# Patient Record
Sex: Female | Born: 1965 | Race: Black or African American | Hispanic: No | Marital: Single | Smoking: Never smoker
Health system: Southern US, Community
[De-identification: ages and names within clinical notes are randomized; demographics above are authoritative.]

## PROBLEM LIST (undated history)

## (undated) DIAGNOSIS — I10 Essential (primary) hypertension: Secondary | ICD-10-CM

## (undated) HISTORY — PX: WISDOM TOOTH EXTRACTION: SHX21

---

## 2016-09-14 ENCOUNTER — Encounter (HOSPITAL_BASED_OUTPATIENT_CLINIC_OR_DEPARTMENT_OTHER): Payer: Self-pay | Admitting: *Deleted

## 2016-09-14 ENCOUNTER — Emergency Department (HOSPITAL_BASED_OUTPATIENT_CLINIC_OR_DEPARTMENT_OTHER)
Admission: EM | Admit: 2016-09-14 | Discharge: 2016-09-14 | Disposition: A | Discharge status: Home / Self Care | Payer: Federal, State, Local not specified - PPO | Attending: Emergency Medicine | Admitting: Emergency Medicine

## 2016-09-14 ENCOUNTER — Emergency Department (HOSPITAL_BASED_OUTPATIENT_CLINIC_OR_DEPARTMENT_OTHER): Payer: Federal, State, Local not specified - PPO

## 2016-09-14 DIAGNOSIS — Z91040 Latex allergy status: Secondary | ICD-10-CM | POA: Diagnosis not present

## 2016-09-14 DIAGNOSIS — G44229 Chronic tension-type headache, not intractable: Secondary | ICD-10-CM | POA: Diagnosis not present

## 2016-09-14 DIAGNOSIS — I10 Essential (primary) hypertension: Secondary | ICD-10-CM | POA: Insufficient documentation

## 2016-09-14 DIAGNOSIS — R51 Headache: Secondary | ICD-10-CM | POA: Diagnosis present

## 2016-09-14 HISTORY — DX: Essential (primary) hypertension: I10

## 2016-09-14 LAB — BASIC METABOLIC PANEL
ANION GAP: 10 (ref 5–15)
BUN: 15 mg/dL (ref 6–20)
CALCIUM: 9.5 mg/dL (ref 8.9–10.3)
CO2: 26 mmol/L (ref 22–32)
Chloride: 104 mmol/L (ref 101–111)
Creatinine, Ser: 0.73 mg/dL (ref 0.44–1.00)
Glucose, Bld: 91 mg/dL (ref 65–99)
POTASSIUM: 3.7 mmol/L (ref 3.5–5.1)
Sodium: 140 mmol/L (ref 135–145)

## 2016-09-14 LAB — CBC WITH DIFFERENTIAL/PLATELET
BASOS ABS: 0 10*3/uL (ref 0.0–0.1)
BASOS PCT: 0 %
Eosinophils Absolute: 0.2 10*3/uL (ref 0.0–0.7)
Eosinophils Relative: 4 %
HEMATOCRIT: 40.4 % (ref 36.0–46.0)
HEMOGLOBIN: 13.8 g/dL (ref 12.0–15.0)
Lymphocytes Relative: 36 %
Lymphs Abs: 1.7 10*3/uL (ref 0.7–4.0)
MCH: 32.3 pg (ref 26.0–34.0)
MCHC: 34.2 g/dL (ref 30.0–36.0)
MCV: 94.6 fL (ref 78.0–100.0)
Monocytes Absolute: 0.4 10*3/uL (ref 0.1–1.0)
Monocytes Relative: 8 %
NEUTROS ABS: 2.4 10*3/uL (ref 1.7–7.7)
Neutrophils Relative %: 52 %
Platelets: 230 10*3/uL (ref 150–400)
RBC: 4.27 MIL/uL (ref 3.87–5.11)
RDW: 12.2 % (ref 11.5–15.5)
WBC: 4.7 10*3/uL (ref 4.0–10.5)

## 2016-09-14 MED ORDER — PROCHLORPERAZINE EDISYLATE 5 MG/ML IJ SOLN: 10.0000 mg | Freq: Once | INTRAMUSCULAR | Status: DC

## 2016-09-14 MED ORDER — DIPHENHYDRAMINE HCL 50 MG/ML IJ SOLN: 12.5000 mg | Freq: Once | INTRAMUSCULAR | Status: DC

## 2016-09-14 MED ORDER — AMLODIPINE BESYLATE 10 MG PO TABS: 10.0000 mg | ORAL_TABLET | Freq: Every day | ORAL | 0 refills | Status: DC

## 2016-09-14 MED ORDER — SODIUM CHLORIDE 0.9 % IV BOLUS (SEPSIS)
1000.0000 mL | Freq: Once | INTRAVENOUS | Status: AC
  Administered 2016-09-14: 1000 mL via INTRAVENOUS

## 2016-09-14 MED ORDER — KETOROLAC TROMETHAMINE 30 MG/ML IJ SOLN
30.0000 mg | Freq: Once | INTRAMUSCULAR | Status: AC
  Administered 2016-09-14: 30 mg via INTRAVENOUS
  Filled 2016-09-14: qty 1

## 2016-09-14 MED ORDER — AMLODIPINE BESYLATE 5 MG PO TABS
5.0000 mg | ORAL_TABLET | Freq: Once | ORAL | Status: AC
  Administered 2016-09-14: 5 mg via ORAL
  Filled 2016-09-14: qty 1

## 2016-09-14 MED ORDER — ACETAMINOPHEN 325 MG PO TABS
650.0000 mg | ORAL_TABLET | Freq: Once | ORAL | Status: AC
  Administered 2016-09-14: 650 mg via ORAL
  Filled 2016-09-14: qty 2

## 2016-09-14 NOTE — ED Notes (Signed)
Crushed amlodipine and put in applesauce for patient

## 2016-09-14 NOTE — ED Notes (Signed)
Pt verbalizes understanding of d/c instructions and denies any further needs at this time. 

## 2016-09-14 NOTE — Discharge Instructions (Signed)
Please read attached information regarding your condition. Take amlodipine once daily. Take Tylenol or ibuprofen as needed for headache. [facility] at [facility] for further evaluation. Return to ED for worsening headache, head injury, vision changes, chest pain, trouble breathing, leg swelling, numbness, weakness.

## 2016-09-14 NOTE — ED Triage Notes (Addendum)
Headache x 3 days. Pain in her right jaw. States she was given BP medication in December but she did not start taking it.

## 2016-09-14 NOTE — ED Provider Notes (Signed)
[facility]   CSN: [id number] Arrival date & time: 09/14/16  1637     History   Chief Complaint Chief Complaint  Patient presents with  . Headache    HPI Jennifer Griffin is a 50 y.o. female.  HPI Patient, presents to ED for evaluation of headache. States that the headache has been chronic for the past few months. She also reports uncontrolled blood pressure. States that she was started on an unknown blood pressure medication 9 months ago at an urgent care. She took a few doses but stated that "I vomited out the pills whole every time." She has not taken any medication since then. She does have a history of migraines related to menstrual cycle however she states that this headache for the past few weeks has felt more like "a tight band around my head." She has no relief in symptoms with Motrin. However she states that she is taking children's Motrin because she does not like swallowing pills. She was recently evaluated by another provider 2 days ago and was told that she had a normal neurological exam, normal visual exam. However they did recommend a CT of the head. She denies any nausea, vomiting, numbness, chest pain, trouble breathing, lightheadedness, dizziness, headache injury or fall.  Past Medical History:  Diagnosis Date  . Hypertension     There are no active problems to display for this patient.   History reviewed. No pertinent surgical history.  OB History    No data available       Home Medications    Prior to Admission medications   Medication Sig Start Date End Date Taking? Authorizing Provider  amLODipine (NORVASC) 10 MG tablet Take 1 tablet (10 mg total) by mouth daily. 09/14/16   Dietrich Pates, PA-C    Family History No family history on file.  Social History Social History  Substance Use Topics  . Smoking status: Never Smoker  . Smokeless tobacco: Never Used  . Alcohol use No     Allergies   Latex   Review of  Systems Review of Systems  Constitutional: Negative for appetite change, chills and fever.  HENT: Negative for ear pain, rhinorrhea, sneezing and sore throat.   Eyes: Negative for photophobia and visual disturbance.  Respiratory: Negative for cough, chest tightness, shortness of breath and wheezing.   Cardiovascular: Negative for chest pain and palpitations.  Gastrointestinal: Negative for abdominal pain, blood in stool, constipation, diarrhea, nausea and vomiting.  Genitourinary: Negative for dysuria, hematuria and urgency.  Musculoskeletal: Negative for myalgias.  Skin: Negative for rash.  Neurological: Positive for headaches. Negative for dizziness, weakness and light-headedness.     Physical Exam Updated Vital Signs BP (!) 166/113   Pulse 77   Temp 98.1 F (36.7 C) (Oral)   Resp 14   Ht  (1.575 m)   Wt 68 kg (150 lb)   LMP 06/15/2015 (Approximate)   SpO2 99%   BMI 27.44 kg/m   Physical Exam  Constitutional: She is oriented to person, place, and time. She appears well-developed and well-nourished. No distress.  HENT:  Head: Normocephalic and atraumatic.  Nose: Nose normal.  Eyes: Conjunctivae and EOM are normal. Left eye exhibits no discharge. No scleral icterus.  Neck: Normal range of motion. Neck supple.  Cardiovascular: Normal rate, regular rhythm, normal heart sounds and intact distal pulses.  Exam reveals no gallop and no friction rub.   No murmur heard. Pulmonary/Chest: Effort normal and breath sounds normal. No respiratory  distress.  Abdominal: Soft. Bowel sounds are normal. She exhibits no distension. There is no tenderness. There is no guarding.  Musculoskeletal: Normal range of motion. She exhibits no edema.  Neurological: She is alert and oriented to person, place, and time. No cranial nerve deficit or sensory deficit. She exhibits normal muscle tone. Coordination normal.  Pupils reactive. No facial asymmetry noted. Cranial nerves appear grossly intact.  Sensation intact to light touch on face, BUE and BLE. Strength 5/5 in BUE and BLE. Normal patellar reflexes bilaterally.  Skin: Skin is warm and dry. No rash noted.  Psychiatric: She has a normal mood and affect.  Nursing note and vitals reviewed.    ED Treatments / Results  Labs (all labs ordered are listed, but only abnormal results are displayed) Labs Reviewed  BASIC METABOLIC PANEL  CBC WITH DIFFERENTIAL/PLATELET    EKG  EKG Interpretation  Date/Time:  Thursday September 14 2016 17:02:41 EDT Ventricular Rate:  64 PR Interval:  178 QRS Duration: 76 QT Interval:  378 QTC Calculation: 389 R Axis:   54 Text Interpretation:  Normal sinus rhythm Cannot rule out Anterior infarct , age undetermined Abnormal ECG normal. no old comparison Confirmed by Arby Barrette [phone]) on 09/14/2016 6:56:30 PM       Radiology Ct Head Wo Contrast  Result Date: 09/14/2016 CLINICAL DATA:  Chronic headaches for many years with increasing headaches for the last 3 days. EXAM: CT HEAD WITHOUT CONTRAST TECHNIQUE: Contiguous axial images were obtained from the base of the skull through the vertex without intravenous contrast. COMPARISON:  None. FINDINGS: Brain: No evidence of acute infarction, hemorrhage, hydrocephalus, extra-axial collection or mass lesion/mass effect. Vascular: No hyperdense vessel or unexpected calcification. Skull: Normal. Negative for fracture or focal lesion. Sinuses/Orbits: No acute finding. Other: None. IMPRESSION: No focal acute intracranial abnormality identified. Electronically Signed   By: Sherian Rein M.D.   On: 09/14/2016 18:17    Procedures Procedures (including critical care time)  Medications Ordered in ED Medications  sodium chloride 0.9 % bolus 1,000 mL (0 mLs Intravenous Stopped 09/14/16 1903)  acetaminophen (TYLENOL) tablet 650 mg (650 mg Oral Given 09/14/16 1904)  amLODipine (NORVASC) tablet 5 mg (5 mg Oral Given 09/14/16 2008)  ketorolac (TORADOL) 30 MG/ML injection  30 mg (30 mg Intravenous Given 09/14/16 2020)     Initial Impression / Assessment and Plan / ED Course  I have reviewed the triage vital signs and the nursing notes.  Pertinent labs & imaging results that were available during my care of the patient were reviewed by me and considered in my medical decision making (see chart for details).     Patient presents to ED for evaluation of headache that has been chronic for the past few months. She also has a history of hypertension which she states she is on any medications for. She states she is "not good with swallowing pills because I just vomited them up all." For headache she has tried Children's Motrin with no improvement in symptoms. She denies any vision changes, numbness, weakness, chest pain or shortness of breath. There are no focal deficits on neurological exam. She is otherwise nontoxic appearing and in no acute distress. Blood pressure is elevated here at 170s/100s. She is unsure of which blood pressure medication she was on December. CT of the head was unremarkable. BMP and CBC are unremarkable. Her vital signs are otherwise normal. Patient given fluids, Tylenol with no improvement in headache. She does not have a ride home so unable to  get migraine cocktail. We'll give Toradol and reassess.  Patient reports some improvement with Toradol. However blood pressure still continues to be elevated, although has improved since initial presentation. Patient given amlodipine here in the ED and will discharge with amlodipine and advised to [facility] at [facility] for further evaluation. Patient appears stable for discharge at this time. Strict return precautions given.  Patient discussed with Dr. Donnald GarrePfeiffer.  Final Clinical Impressions(s) / ED Diagnoses   Final diagnoses:  Chronic tension-type headache, not intractable  Hypertension, unspecified type    New Prescriptions New Prescriptions   AMLODIPINE (NORVASC) 10 MG TABLET    Take  1 tablet (10 mg total) by mouth daily.     Dietrich PatesKhatri, Shanette Tamargo, PA-C 09/15/16 0018    Arby BarrettePfeiffer, Marcy, MD 09/21/16 [phone]

## 2018-03-04 IMAGING — CT CT HEAD W/O CM
3 series · 15 of 46 positions shown, 18 images · non-contrast
Comparison: None.

CLINICAL DATA: Chronic headaches for many years with increasing
headaches for the last 3 days.

EXAM:
CT HEAD WITHOUT CONTRAST
TECHNIQUE: Contiguous axial images were obtained from the base of the skull
through the vertex without intravenous contrast.

[Series 2: head wo · axial · 0.39mm/px · z∈[-98,+22]mm · 9 of 29 slices shown, 12 images]
[im 3/29  brain]
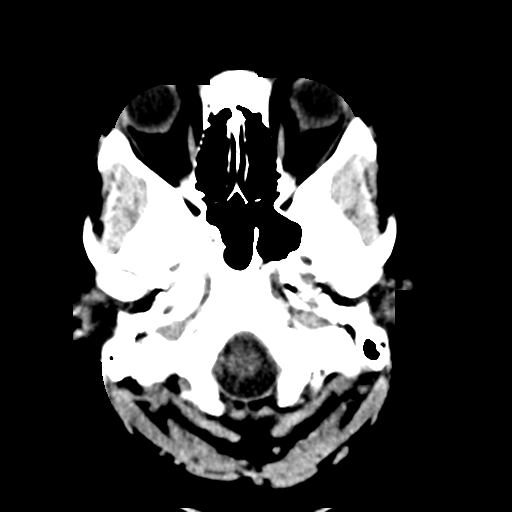
[im 3/29  bone]
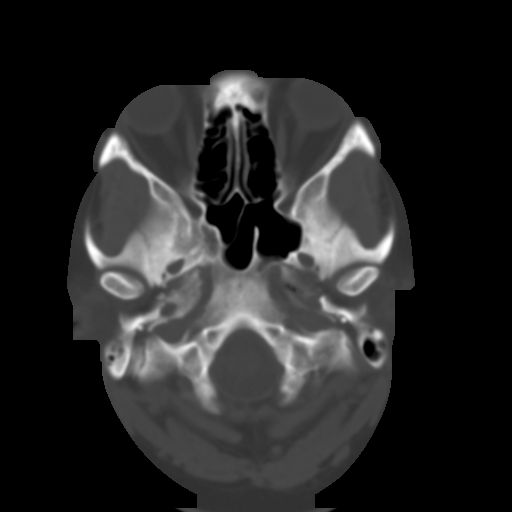
[im 6/29  brain]
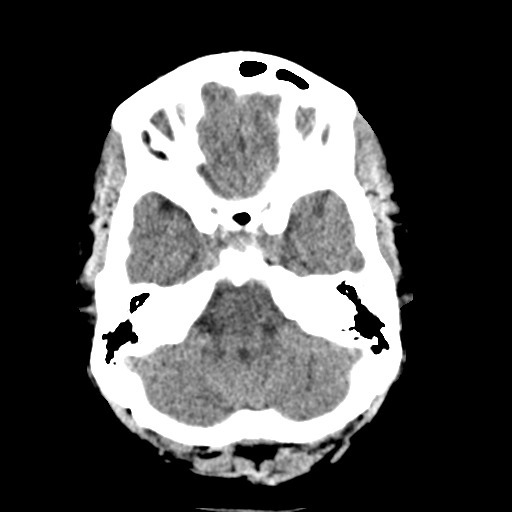
[im 9/29  brain]
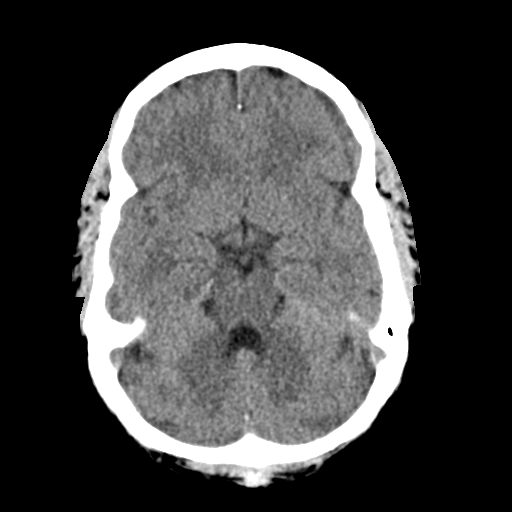
[im 12/29  brain]
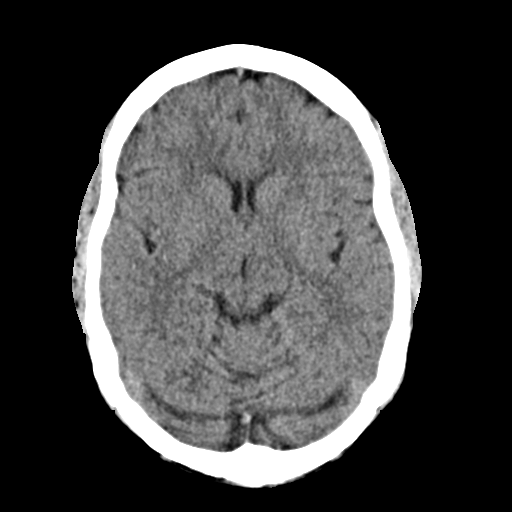
[im 15/29  brain]
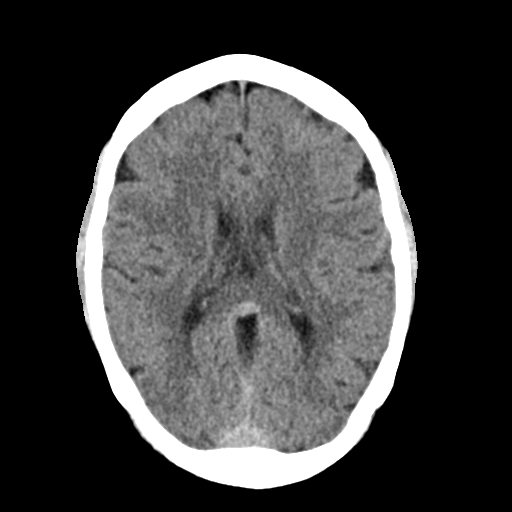
[im 15/29  bone]
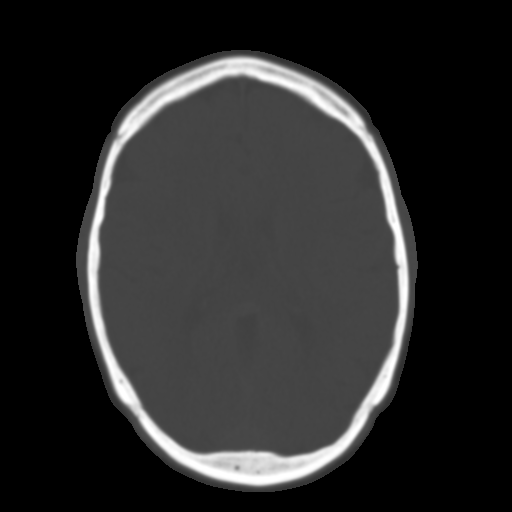
[im 18/29  brain]
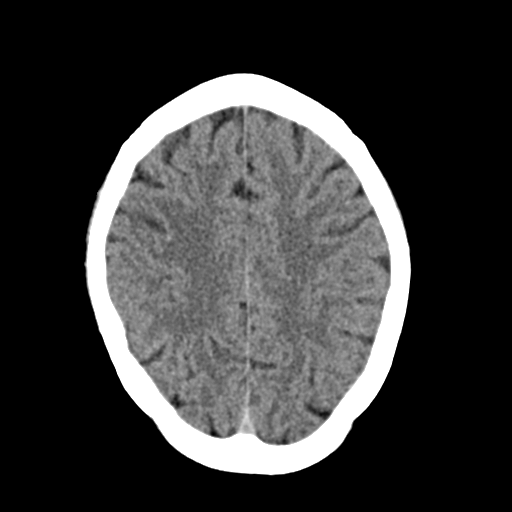
[im 21/29  brain]
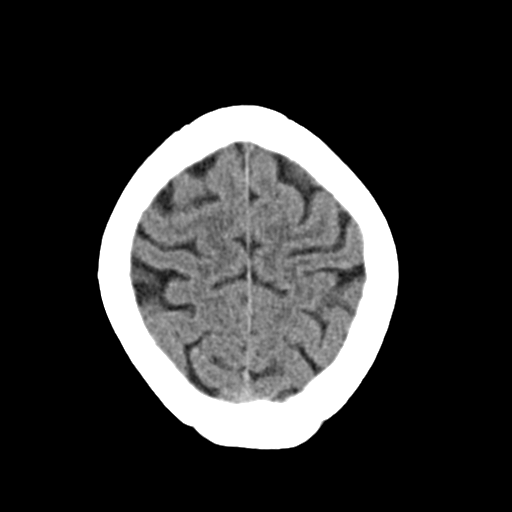
[im 24/29  brain]
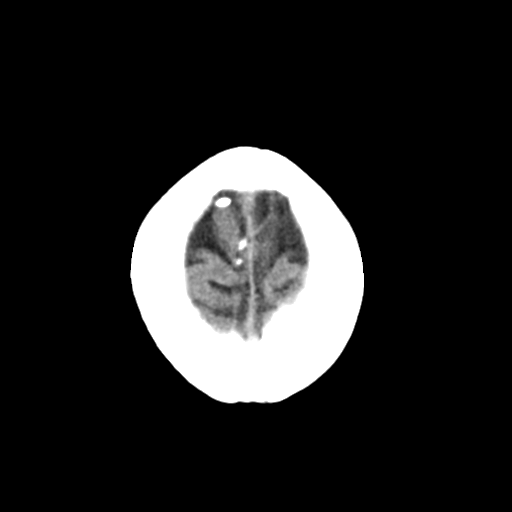
[im 27/29  brain]
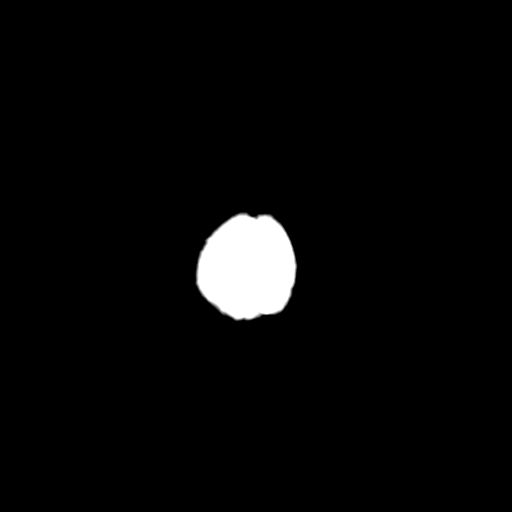
[im 27/29  bone]
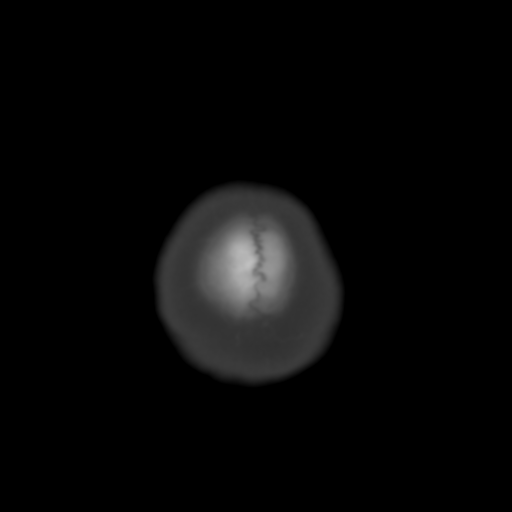

[Series 4: coronal soft · coronal · 0.28mm/px · 3 of 67 slices shown]
[im 23/67  brain]
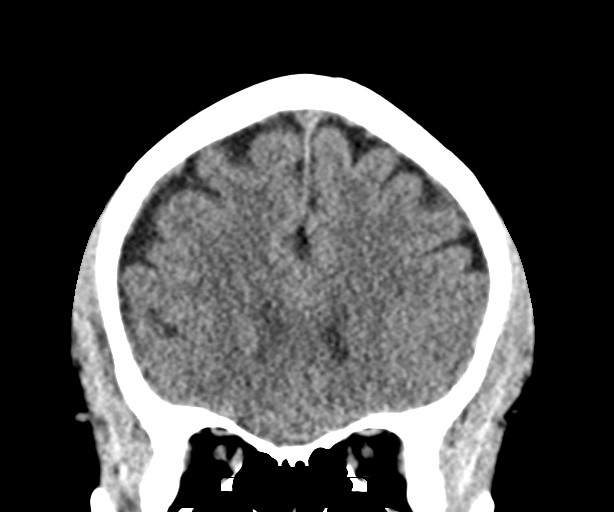
[im 30/67  brain]
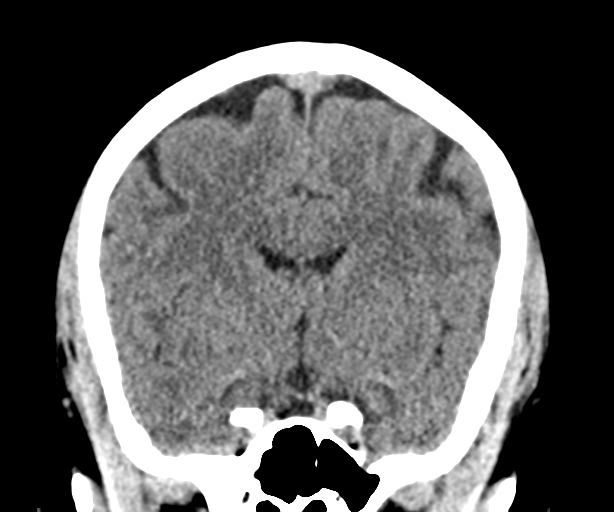
[im 37/67  brain]
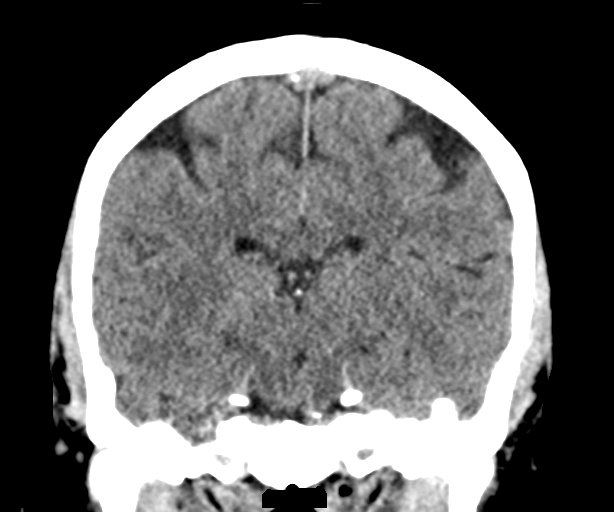

[Series 5: sag soft · sagittal · 0.28mm/px · 3 of 57 slices shown]
[im 19/57  brain]
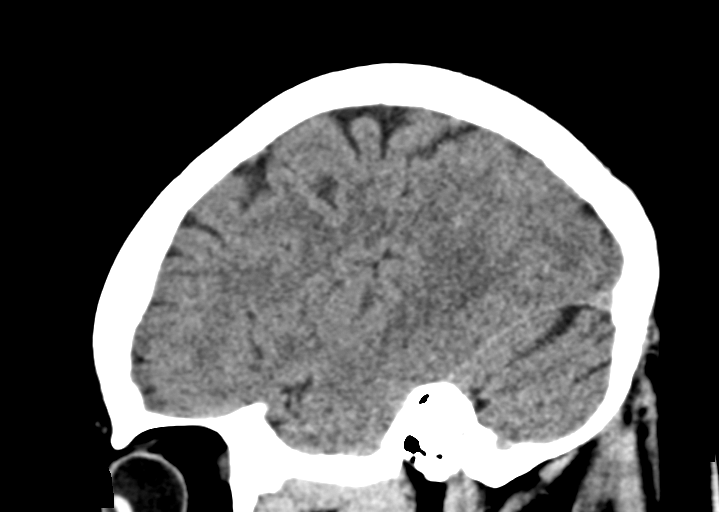
[im 29/57  brain]
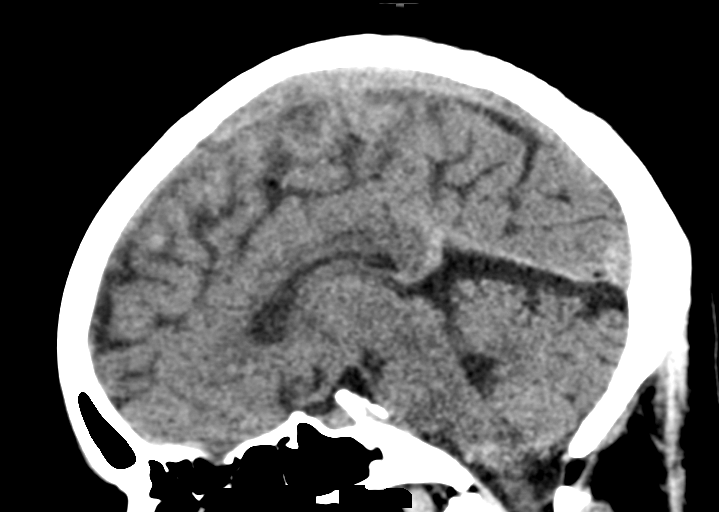
[im 38/57  brain]
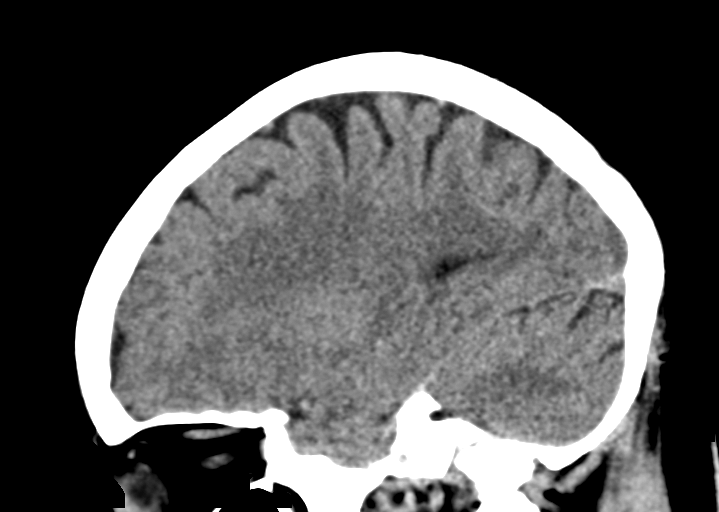

[15 of 46 positions shown; findings below may reference images not displayed]

FINDINGS: Brain: No evidence of acute infarction, hemorrhage, hydrocephalus,
extra-axial collection or mass lesion/mass effect.

Vascular: No hyperdense vessel or unexpected calcification.

Skull: Normal. Negative for fracture or focal lesion.

Sinuses/Orbits: No acute finding.

Other: None.
IMPRESSION: No focal acute intracranial abnormality identified.

## 2020-11-13 ENCOUNTER — Other Ambulatory Visit: Payer: Self-pay

## 2020-11-13 ENCOUNTER — Encounter (HOSPITAL_BASED_OUTPATIENT_CLINIC_OR_DEPARTMENT_OTHER): Payer: Self-pay | Admitting: Emergency Medicine

## 2020-11-13 ENCOUNTER — Emergency Department (HOSPITAL_BASED_OUTPATIENT_CLINIC_OR_DEPARTMENT_OTHER)
Admission: EM | Admit: 2020-11-13 | Discharge: 2020-11-13 | Disposition: A | Discharge status: Home / Self Care | Payer: Federal, State, Local not specified - PPO | Attending: Emergency Medicine | Admitting: Emergency Medicine

## 2020-11-13 DIAGNOSIS — Z91040 Latex allergy status: Secondary | ICD-10-CM | POA: Diagnosis not present

## 2020-11-13 DIAGNOSIS — I10 Essential (primary) hypertension: Secondary | ICD-10-CM | POA: Insufficient documentation

## 2020-11-13 DIAGNOSIS — R197 Diarrhea, unspecified: Secondary | ICD-10-CM

## 2020-11-13 DIAGNOSIS — Z79899 Other long term (current) drug therapy: Secondary | ICD-10-CM | POA: Insufficient documentation

## 2020-11-13 DIAGNOSIS — Z20822 Contact with and (suspected) exposure to covid-19: Secondary | ICD-10-CM | POA: Insufficient documentation

## 2020-11-13 DIAGNOSIS — N3001 Acute cystitis with hematuria: Secondary | ICD-10-CM

## 2020-11-13 LAB — CBC
HCT: 45.7 % (ref 36.0–46.0)
Hemoglobin: 15.7 g/dL — ABNORMAL HIGH (ref 12.0–15.0)
MCH: 32.4 pg (ref 26.0–34.0)
MCHC: 34.4 g/dL (ref 30.0–36.0)
MCV: 94.2 fL (ref 80.0–100.0)
Platelets: 317 10*3/uL (ref 150–400)
RBC: 4.85 MIL/uL (ref 3.87–5.11)
RDW: 12.2 % (ref 11.5–15.5)
WBC: 5.8 10*3/uL (ref 4.0–10.5)
nRBC: 0 % (ref 0.0–0.2)

## 2020-11-13 LAB — COMPREHENSIVE METABOLIC PANEL
ALT: 23 U/L (ref 0–44)
AST: 23 U/L (ref 15–41)
Albumin: 4.6 g/dL (ref 3.5–5.0)
Alkaline Phosphatase: 75 U/L (ref 38–126)
Anion gap: 12 (ref 5–15)
BUN: 13 mg/dL (ref 6–20)
CO2: 24 mmol/L (ref 22–32)
Calcium: 10.2 mg/dL (ref 8.9–10.3)
Chloride: 104 mmol/L (ref 98–111)
Creatinine, Ser: 0.84 mg/dL (ref 0.44–1.00)
GFR, Estimated: >60 mL/min (ref >60)
Glucose, Bld: 122 mg/dL — ABNORMAL HIGH (ref 70–99)
Potassium: 3.3 mmol/L — ABNORMAL LOW (ref 3.5–5.1)
Sodium: 140 mmol/L (ref 135–145)
Total Bilirubin: 0.6 mg/dL (ref 0.3–1.2)
Total Protein: 8.7 g/dL — ABNORMAL HIGH (ref 6.5–8.1)

## 2020-11-13 LAB — RESP PANEL BY RT-PCR (FLU A&B, COVID) ARPGX2
Influenza A by PCR: NEGATIVE
Influenza B by PCR: NEGATIVE
SARS Coronavirus 2 by RT PCR: NEGATIVE

## 2020-11-13 LAB — URINALYSIS, MICROSCOPIC (REFLEX)

## 2020-11-13 LAB — LIPASE, BLOOD: Lipase: 29 U/L (ref 11–51)

## 2020-11-13 LAB — URINALYSIS, ROUTINE W REFLEX MICROSCOPIC
Bilirubin Urine: NEGATIVE
Glucose, UA: NEGATIVE mg/dL
Ketones, ur: NEGATIVE mg/dL
Nitrite: NEGATIVE
Protein, ur: 30 mg/dL — AB
Specific Gravity, Urine: 1.025 (ref 1.005–1.030)
pH: 6.5 (ref 5.0–8.0)

## 2020-11-13 LAB — PREGNANCY, URINE: Preg Test, Ur: NEGATIVE

## 2020-11-13 MED ORDER — AMLODIPINE BESYLATE 10 MG PO TABS: 10.0000 mg | ORAL_TABLET | Freq: Every day | ORAL | 0 refills | Status: DC

## 2020-11-13 MED ORDER — SODIUM CHLORIDE 0.9 % IV BOLUS
1000.0000 mL | Freq: Once | INTRAVENOUS | Status: AC
  Administered 2020-11-13: 1000 mL via INTRAVENOUS

## 2020-11-13 NOTE — ED Triage Notes (Signed)
Pt reports B/L leg pain and fullness sensation in her head onset Wednesday. Pt seen by urgent care same day and was restarted on blood pressure medications and antibiotics for UTI. Pt has been off her blood pressure medications for couple years. Pt was feeling better until today when the symptoms returned and having diarrhea.

## 2020-11-13 NOTE — Discharge Instructions (Addendum)
You are seen in the emergency department today for diarrhea.  As we discussed I think your diarrhea could be related to a viral illness, or a side effect of the antibiotics that you are taking.  I think some of the pain that you are having was related to the urinary tract infection.  I would like you to continue the antibiotic that was prescribed for this, make sure that you are drinking plenty of water and eating meals while taking this medication.  As we discussed I also think it would be pertinent to start taking a probiotic, you can find this in yogurt or liquid formulations at the pharmacy.  I am also refilling your blood pressure medication.  It does not matter at what time of the day you take this, but make sure that you are taking it daily.  Continue to monitor how you're doing and return to the ER for new or worsening symptoms such as fevers, abdominal pain, copious diarrhea.   It has been a pleasure seeing and caring for you today and I hope you start feeling better soon!

## 2020-11-13 NOTE — ED Provider Notes (Signed)
[facility] EMERGENCY DEPARTMENT Provider Note   CSN: [id number] Arrival date & time: 11/13/20  8295     History Chief Complaint  Patient presents with   Hypertension    Jennifer Griffin is a 55 y.o. female who presents emergency department today for bilateral leg pain and headache x 4 days.  Patient states that she was seen at urgent care on that same day, and had tested negative for COVID and flu.  She was restarted on blood pressure medications as she had not been on this for several years, and was also prescribed antibiotics for urinary tract infection.  Patient states that she was taking the antibiotic as prescribed, but was unable to fill the blood pressure medicine.  She states that she was taking expired blood pressure medication at home instead.  She believes that her symptoms are resolving until yesterday when the symptoms returned and she began having diarrhea.  She states she was "afraid that the diarrhea washed out all the medication she had been taking".  Patient reports she was attempting to eat a meal while taking the antibiotic, but did not have good fluid intake for the past several days.  No fevers, chills, chest pain, shortness of breath, abdominal pain.   Hypertension Associated symptoms include headaches. Pertinent negatives include no chest pain, no abdominal pain and no shortness of breath.      Past Medical History:  Diagnosis Date   Hypertension     There are no problems to display for this patient.   Past Surgical History:  Procedure Laterality Date   WISDOM TOOTH EXTRACTION       OB History   No obstetric history on file.     No family history on file.  Social History   Tobacco Use   Smoking status: Never   Smokeless tobacco: Never  Vaping Use   Vaping Use: Never used  Substance Use Topics   Alcohol use: No   Drug use: No    Home Medications Prior to Admission medications   Medication Sig Start Date End Date Taking?  Authorizing Provider  amLODipine (NORVASC) 10 MG tablet Take 1 tablet (10 mg total) by mouth daily. 11/13/20 12/13/20 Yes Jullie Arps T, PA-C  amoxicillin-clavulanate (AUGMENTIN) 600-42.9 MG/5ML suspension Take by mouth 2 (two) times daily.   Yes [provider]    Allergies    Latex  Review of Systems   Review of Systems  Constitutional:  Negative for chills and fever.  HENT:  Negative for congestion, sore throat and trouble swallowing.   Eyes:  Negative for visual disturbance.  Respiratory:  Negative for cough and shortness of breath.   Cardiovascular:  Negative for chest pain, palpitations and leg swelling.  Gastrointestinal:  Positive for diarrhea and nausea. Negative for abdominal pain, blood in stool, constipation and vomiting.  Genitourinary:  Negative for dysuria, flank pain, frequency, hematuria and urgency.  Musculoskeletal:  Positive for myalgias.  Neurological:  Positive for headaches. Negative for dizziness, weakness and light-headedness.  All other systems reviewed and are negative.  Physical Exam Updated Vital Signs BP (!) 152/102   Pulse 93   Temp 99.1 F (37.3 C) (Oral)   Resp (!) 21   Ht 5\' 2"  (1.575 m)   Wt 63.5 kg   SpO2 97%   BMI 25.61 kg/m   Physical Exam Vitals and nursing note reviewed.  Constitutional:      Appearance: Normal appearance.  HENT:     Head: Normocephalic and atraumatic.  Eyes:     Conjunctiva/sclera: Conjunctivae normal.  Cardiovascular:     Rate and Rhythm: Normal rate and regular rhythm.  Pulmonary:     Effort: Pulmonary effort is normal. No respiratory distress.     Breath sounds: Normal breath sounds.  Abdominal:     General: There is no distension.     Palpations: Abdomen is soft.     Tenderness: There is no abdominal tenderness.  Skin:    General: Skin is warm and dry.  Neurological:     General: No focal deficit present.     Mental Status: She is alert.    ED Results / Procedures / Treatments    Labs (all labs ordered are listed, but only abnormal results are displayed) Labs Reviewed  COMPREHENSIVE METABOLIC PANEL - Abnormal; Notable for the following components:      Result Value   Potassium 3.3 (*)    Glucose, Bld 122 (*)    Total Protein 8.7 (*)    All other components within normal limits  CBC - Abnormal; Notable for the following components:   Hemoglobin 15.7 (*)    All other components within normal limits  URINALYSIS, ROUTINE W REFLEX MICROSCOPIC - Abnormal; Notable for the following components:   Hgb urine dipstick MODERATE (*)    Protein, ur 30 (*)    Leukocytes,Ua TRACE (*)    All other components within normal limits  URINALYSIS, MICROSCOPIC (REFLEX) - Abnormal; Notable for the following components:   Bacteria, UA FEW (*)    All other components within normal limits  RESP PANEL BY RT-PCR (FLU A&B, COVID) ARPGX2  LIPASE, BLOOD  PREGNANCY, URINE    EKG EKG Interpretation  Date/Time:  Saturday November 13 2020 11:09:11 EDT Ventricular Rate:  85 PR Interval:  187 QRS Duration: 85 QT Interval:  349 QTC Calculation: 415 R Axis:   73 Text Interpretation: Sinus rhythm Confirmed by Marianna Fuss ([id number]) on 11/13/2020 12:25:03 PM  Radiology No results found.  Procedures Procedures   Medications Ordered in ED Medications  sodium chloride 0.9 % bolus 1,000 mL (0 mLs Intravenous Stopped 11/13/20 1124)    ED Course  I have reviewed the triage vital signs and the nursing notes.  Pertinent labs & imaging results that were available during my care of the patient were reviewed by me and considered in my medical decision making (see chart for details).    MDM Rules/Calculators/A&P                           Patient is a 55 year old female with a history of hypertension the presents emergency department today for 2 days of diarrhea.  Patient was recently seen at urgent care, and was diagnosed with urinary tract infection and placed on antibiotics.  Had  negative COVID and flu testing.  She states that her symptoms seem to resolve, but returned yesterday with diarrhea.  She also reports the pharmacy was unable to fill the formulation of her blood pressure medication, so she has been taking expired medication.  She reports consistent headache, unchanged for her.  Denies vision changes, chest pain, shortness of breath.  On exam patient is afebrile, not tachycardic, no acute distress.  Abdomen is soft, nontender nondistended.  Lab work so far has been unremarkable.  Urinalysis shows no change in urinary tract infection, with some mild hematuria.  We repeated COVID and flu testing, and both of these were negative.  After a liter of fluids  patient states that her symptoms have improved and objectively her heart rate came down, and she is no longer tachycardic.  She overall is hemodynamically stable and not requiring admission or inpatient treatment for her symptoms at this time.  Symptoms are likely the results either of viral gastrointestinal illness, or adverse reaction to antibiotic for urinary tract infection.  Plan to refill her prescribed hypertension medication.  Discussed reasons to return to the emergency department.  Patient agreeable to plan.  I discussed this case with my attending physician Dr. Roslynn Amble who cosigned this note including patient's presenting symptoms, physical exam, and planned diagnostics and interventions. Attending physician stated agreement with plan or made changes to plan which were implemented.    Final Clinical Impression(s) / ED Diagnoses Final diagnoses:  Diarrhea, unspecified type  Acute cystitis with hematuria    Rx / DC Orders ED Discharge Orders          Ordered    amLODipine (NORVASC) 10 MG tablet  Daily        11/13/20 1233             Mikhai Bienvenue T, PA-C 11/13/20 1241    Lucrezia Starch, MD 11/13/20 2105

## 2021-02-08 ENCOUNTER — Other Ambulatory Visit: Payer: Self-pay

## 2021-02-08 ENCOUNTER — Encounter: Payer: Self-pay | Admitting: Family Medicine

## 2021-02-08 ENCOUNTER — Ambulatory Visit: Payer: Federal, State, Local not specified - PPO | Admitting: Family Medicine

## 2021-02-08 VITALS — BP 126/82 | HR 94 | Temp 97.0°F | Ht 61.0 in | Wt 137.0 lb

## 2021-02-08 DIAGNOSIS — I10 Essential (primary) hypertension: Secondary | ICD-10-CM | POA: Diagnosis not present

## 2021-02-08 DIAGNOSIS — E559 Vitamin D deficiency, unspecified: Secondary | ICD-10-CM | POA: Diagnosis not present

## 2021-02-08 DIAGNOSIS — Z114 Encounter for screening for human immunodeficiency virus [HIV]: Secondary | ICD-10-CM | POA: Diagnosis not present

## 2021-02-08 DIAGNOSIS — Z780 Asymptomatic menopausal state: Secondary | ICD-10-CM

## 2021-02-08 DIAGNOSIS — Z0000 Encounter for general adult medical examination without abnormal findings: Secondary | ICD-10-CM

## 2021-02-08 DIAGNOSIS — Z1159 Encounter for screening for other viral diseases: Secondary | ICD-10-CM | POA: Diagnosis not present

## 2021-02-08 MED ORDER — AMLODIPINE BESYLATE 10 MG PO TABS: 10.0000 mg | ORAL_TABLET | Freq: Every day | ORAL | 1 refills | Status: AC

## 2021-02-08 NOTE — Progress Notes (Addendum)
New Patient Office Visit  Subjective:  Patient ID: Jennifer Griffin, female    DOB: 04/05/1965  Age: 56 y.o. MRN: [id number]  CC:  Chief Complaint  Patient presents with   Establish Care    NP/establish care, concerns about right leg pains headaches that come and go preventing pt from driving sometimes. Would to come off BP medications if she can control levels.     HPI Eunice Raper presents for establish care and follow-up of her high blood pressure.  She is here for a physical as well.  6 hours fasting.  Ongoing history of hypertension.  She self discontinued her medications and her high blood pressure was diagnosed again at a office visit to a physician.  Cannot remember her last Pap smear.  She has never had an mammogram or colonoscopy.  She lives alone.  She is not active sexually.  She has no children.  Her father passed when he was 72 and her mom passed when she was 61.  She was unaware of their health history.  Past Medical History:  Diagnosis Date   Hypertension     Past Surgical History:  Procedure Laterality Date   WISDOM TOOTH EXTRACTION      History reviewed. No pertinent family history.  Social History   Socioeconomic History   Marital status: Single    Spouse name: Not on file   Number of children: Not on file   Years of education: Not on file   Highest education level: Not on file  Occupational History   Not on file  Tobacco Use   Smoking status: Never   Smokeless tobacco: Never  Vaping Use   Vaping Use: Never used  Substance and Sexual Activity   Alcohol use: No   Drug use: No   Sexual activity: Not Currently  Other Topics Concern   Not on file  Social History Narrative   Not on file   Social Determinants of Health   Financial Resource Strain: Not on file  Food Insecurity: Not on file  Transportation Needs: Not on file  Physical Activity: Not on file  Stress: Not on file  Social Connections: Not on file  Intimate Partner Violence: Not  on file    ROS Review of Systems  Constitutional:  Negative for chills, diaphoresis, fatigue, fever and unexpected weight change.  HENT: Negative.    Eyes:  Negative for photophobia and visual disturbance.  Respiratory:  Negative for cough, chest tightness, shortness of breath and wheezing.   Cardiovascular: Negative.  Negative for chest pain and palpitations.  Gastrointestinal: Negative.   Genitourinary: Negative.   Musculoskeletal:  Negative for gait problem and joint swelling.  Neurological:  Positive for headaches. Negative for speech difficulty, weakness and light-headedness.  Hematological:  Does not bruise/bleed easily.  Psychiatric/Behavioral: Negative.    Depression screen [facility] 2/9 02/08/2021  Decreased Interest 0  Down, Depressed, Hopeless 0  PHQ - 2 Score 0     Objective:   Today's Vitals: BP 126/82 (BP Location: Right Arm, Patient Position: Sitting, Cuff Size: Normal)    Pulse 94    Temp (!) 97 F (36.1 C) (Temporal)    Ht 5\' 1"  (1.549 m) Comment: 5 1' 1/4   Wt 137 lb (62.1 kg)    SpO2 95%    BMI 25.89 kg/m   Physical Exam Vitals and nursing note reviewed.  Constitutional:      General: She is not in acute distress.    Appearance: Normal appearance. She is  not ill-appearing, toxic-appearing or diaphoretic.  HENT:     Head: Normocephalic and atraumatic.     Right Ear: Tympanic membrane, ear canal and external ear normal.     Left Ear: Tympanic membrane, ear canal and external ear normal.     Mouth/Throat:     Mouth: Mucous membranes are moist.     Pharynx: Oropharynx is clear. No oropharyngeal exudate or posterior oropharyngeal erythema.  Eyes:     General: No scleral icterus.       Right eye: No discharge.        Left eye: No discharge.     Conjunctiva/sclera: Conjunctivae normal.     Pupils: Pupils are equal, round, and reactive to light.  Neck:     Vascular: No carotid bruit.  Cardiovascular:     Rate and Rhythm: Normal rate and regular rhythm.   Pulmonary:     Effort: Pulmonary effort is normal.     Breath sounds: Normal breath sounds.  Abdominal:     General: Bowel sounds are normal.  Musculoskeletal:     Cervical back: No rigidity or tenderness.     Lumbar back: No tenderness. Normal range of motion.  Lymphadenopathy:     Cervical: No cervical adenopathy.  Skin:    General: Skin is warm and dry.  Neurological:     Mental Status: She is alert and oriented to person, place, and time.     Cranial Nerves: No dysarthria.     Motor: No weakness.     Comments: 5/5 strength in lower extremities.  Psychiatric:        Mood and Affect: Mood normal.        Behavior: Behavior normal.  Who comes her AVS today  Assessment & Plan:   Problem List Items Addressed This Visit       Cardiovascular and Mediastinum   Essential hypertension - Primary   Relevant Medications   amLODipine (NORVASC) 10 MG tablet   Other Relevant Orders   CBC   Comprehensive metabolic panel   Urinalysis, Routine w reflex microscopic   Microalbumin / creatinine urine ratio     Other   Menopause   Relevant Orders   Ambulatory referral to Gynecology   Healthcare maintenance   Relevant Orders   CBC   Comprehensive metabolic panel   MM Digital Screening   Cologuard   Vitamin D deficiency   Relevant Orders   VITAMIN D 25 Hydroxy (Vit-D Deficiency, Fractures)    Outpatient Encounter Medications as of 02/08/2021  Medication Sig   Vitamin D, Cholecalciferol, 25 MCG (1000 UT) CAPS Take by mouth.   [DISCONTINUED] amLODipine (NORVASC) 10 MG tablet Take 1 tablet (10 mg total) by mouth daily.   amLODipine (NORVASC) 10 MG tablet Take 1 tablet (10 mg total) by mouth daily.   Multiple Vitamin (MULTI-VITAMIN) tablet Take 1 tablet by mouth daily.   [DISCONTINUED] amoxicillin-clavulanate (AUGMENTIN) 600-42.9 MG/5ML suspension Take by mouth 2 (two) times daily.   No facility-administered encounter medications on file as of 02/08/2021.    Follow-up: Return in  about 6 months (around 08/08/2021), or if symptoms worsen or fail to improve.  Information was given on health maintenance and disease prevention.  GYN referral for well woman care.  Information was given on the Cologuard test.  Patient is refusing colonoscopy.  Referring patient for her first mammogram.  We will continue amlodipine.  Information was given on managing hypertension and the Dash diet. Libby Maw, MD   Addendum: CMA tells  me that patient left and is refusing the Cologuard test.  She left information given to her back with [place].

## 2021-02-09 ENCOUNTER — Telehealth: Payer: Self-pay

## 2021-02-09 LAB — COMPREHENSIVE METABOLIC PANEL
ALT: 12 U/L (ref 0–35)
AST: 14 U/L (ref 0–37)
Albumin: 4.8 g/dL (ref 3.5–5.2)
Alkaline Phosphatase: 74 U/L (ref 39–117)
BUN: 15 mg/dL (ref 6–23)
CO2: 30 mEq/L (ref 19–32)
Calcium: 10.3 mg/dL (ref 8.4–10.5)
Chloride: 104 mEq/L (ref 96–112)
Creatinine, Ser: 0.79 mg/dL (ref 0.40–1.20)
GFR: 84.96 mL/min (ref >60.00)
Glucose, Bld: 83 mg/dL (ref 70–99)
Potassium: 3.8 mEq/L (ref 3.5–5.1)
Sodium: 143 mEq/L (ref 135–145)
Total Bilirubin: 0.5 mg/dL (ref 0.2–1.2)
Total Protein: 8 g/dL (ref 6.0–8.3)

## 2021-02-09 LAB — HEPATITIS C ANTIBODY
Hepatitis C Ab: NONREACTIVE
SIGNAL TO CUT-OFF: 0.02 (ref <1.00)

## 2021-02-09 LAB — VITAMIN D 25 HYDROXY (VIT D DEFICIENCY, FRACTURES): VITD: 46.05 ng/mL (ref 30.00–100.00)

## 2021-02-09 LAB — MICROALBUMIN / CREATININE URINE RATIO
Creatinine,U: 100.2 mg/dL
Microalb Creat Ratio: 8 mg/g (ref 0.0–30.0)
Microalb, Ur: 8.1 mg/dL — ABNORMAL HIGH (ref 0.0–1.9)

## 2021-02-09 LAB — URINALYSIS, ROUTINE W REFLEX MICROSCOPIC
Bilirubin Urine: NEGATIVE
Ketones, ur: NEGATIVE
Leukocytes,Ua: NEGATIVE
Nitrite: NEGATIVE
Specific Gravity, Urine: 1.02 (ref 1.000–1.030)
Total Protein, Urine: NEGATIVE
Urine Glucose: NEGATIVE
Urobilinogen, UA: 0.2 (ref 0.0–1.0)
pH: 6.5 (ref 5.0–8.0)

## 2021-02-09 LAB — LIPID PANEL
Cholesterol: 212 mg/dL — ABNORMAL HIGH (ref 0–200)
HDL: 66.3 mg/dL (ref >39.00)
LDL Cholesterol: 123 mg/dL — ABNORMAL HIGH (ref 0–99)
NonHDL: 145.63
Total CHOL/HDL Ratio: 3
Triglycerides: 115 mg/dL (ref 0.0–149.0)
VLDL: 23 mg/dL (ref 0.0–40.0)

## 2021-02-09 LAB — HIV ANTIBODY (ROUTINE TESTING W REFLEX): HIV 1&2 Ab, 4th Generation: NONREACTIVE

## 2021-02-09 LAB — CBC
HCT: 41.2 % (ref 36.0–46.0)
Hemoglobin: 13.9 g/dL (ref 12.0–15.0)
MCHC: 33.6 g/dL (ref 30.0–36.0)
MCV: 96.8 fl (ref 78.0–100.0)
Platelets: 300 10*3/uL (ref 150.0–400.0)
RBC: 4.26 Mil/uL (ref 3.87–5.11)
RDW: 13 % (ref 11.5–15.5)
WBC: 4.3 10*3/uL (ref 4.0–10.5)

## 2021-02-09 NOTE — Telephone Encounter (Signed)
Patient called in and states she isn't ready to scheduled at this time. I advised patient to contact her referring provider to send new referral when patient is ready to scheduled.

## 2021-02-09 NOTE — Telephone Encounter (Signed)
[facility] referring for Menopause. Called and left voicemail for patient to call back to be scheduled.

## 2021-02-10 ENCOUNTER — Telehealth: Payer: Self-pay | Admitting: Family Medicine

## 2021-02-10 NOTE — Telephone Encounter (Signed)
Pt is wanting a call back concerning a recommendation for a Dash Diet specialist and a headache specialist.  She is also wanting to discuss two test that were done on her at her last visit that she did not want.   Please advise pt at [phone]

## 2021-02-15 NOTE — Telephone Encounter (Signed)
Returned patients call per patient she would like a referral to dietician to help with the dash diet and overall diet to help with BP. Patient also states that she has continued headaches every day she would like to see neurologist for this. I went over the reason for HIV and Hep C testing patient did not think this was needed but understood after explanation. Okay for referrals?

## 2021-02-16 NOTE — Telephone Encounter (Signed)
Called patient to inform of message below, no answer LMTCB 

## 2021-02-26 ENCOUNTER — Telehealth (HOSPITAL_BASED_OUTPATIENT_CLINIC_OR_DEPARTMENT_OTHER): Payer: Self-pay

## 2021-05-18 ENCOUNTER — Telehealth: Payer: Self-pay | Admitting: Family Medicine

## 2021-05-18 NOTE — Telephone Encounter (Signed)
Pt requesting Rx for Blood Pressure Cup. She is actually asking if there is a way to get it for free. Pt also would like a referral to a Neurologist and a dietician/nutritionist.  ? ? ?

## 2021-05-18 NOTE — Telephone Encounter (Signed)
Patient calling for referral to Neurologist for continued headaches and referral to nutritionist to help with eating habits. Please advise.  ?

## 2021-05-23 NOTE — Telephone Encounter (Signed)
Spoke with patient tried to schedule appointment for follow up per patient she would like to cancel appointment in October and give [place] a call back to schedule sooner appointment for symptoms.  ?

## 2021-10-17 ENCOUNTER — Ambulatory Visit: Payer: Federal, State, Local not specified - PPO | Admitting: Family Medicine
